# Patient Record
Sex: Female | Born: 1950 | Race: White | Hispanic: No | Marital: Single | Smoking: Never smoker
Health system: Southern US, Community
[De-identification: ages and names within clinical notes are randomized; demographics above are authoritative.]

## PROBLEM LIST (undated history)

## (undated) DIAGNOSIS — E039 Hypothyroidism, unspecified: Secondary | ICD-10-CM

## (undated) DIAGNOSIS — M1990 Unspecified osteoarthritis, unspecified site: Secondary | ICD-10-CM

## (undated) DIAGNOSIS — J45909 Unspecified asthma, uncomplicated: Secondary | ICD-10-CM

## (undated) HISTORY — PX: ANTERIOR CRUCIATE LIGAMENT REPAIR: SHX115

## (undated) HISTORY — PX: EYE SURGERY: SHX253

---

## 2004-04-28 ENCOUNTER — Emergency Department (HOSPITAL_COMMUNITY): Admission: EM | Admit: 2004-04-28 | Discharge: 2004-04-28 | Payer: Self-pay | Admitting: Emergency Medicine

## 2009-11-07 ENCOUNTER — Encounter: Admission: RE | Admit: 2009-11-07 | Discharge: 2009-11-07 | Payer: Self-pay | Admitting: General Surgery

## 2011-04-11 IMAGING — CR DG KNEE 1-2V*R*
2 series · 2 of 2 positions shown · non-contrast
Comparison: None.

CLINICAL DATA: Bilateral knee pain.

RIGHT KNEE - 1-2 VIEW

[w knee ap right]
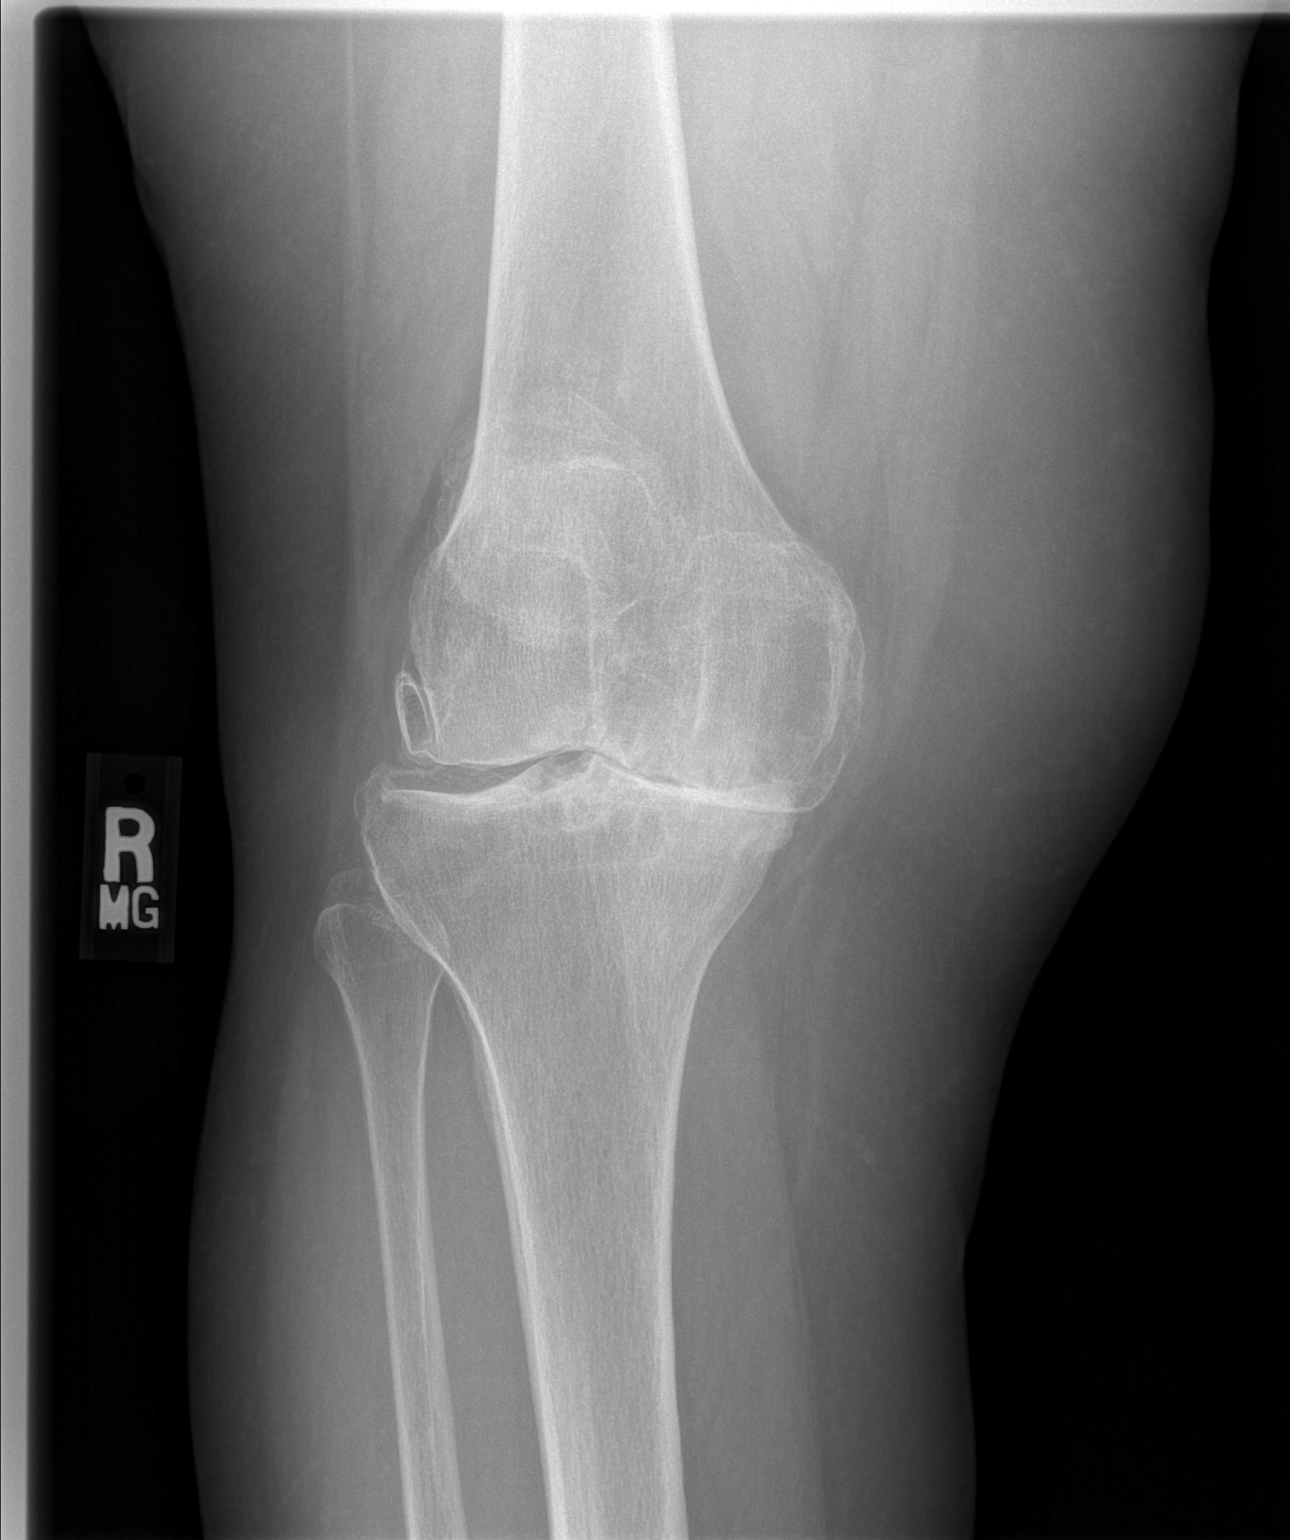

[w knee lat. right]
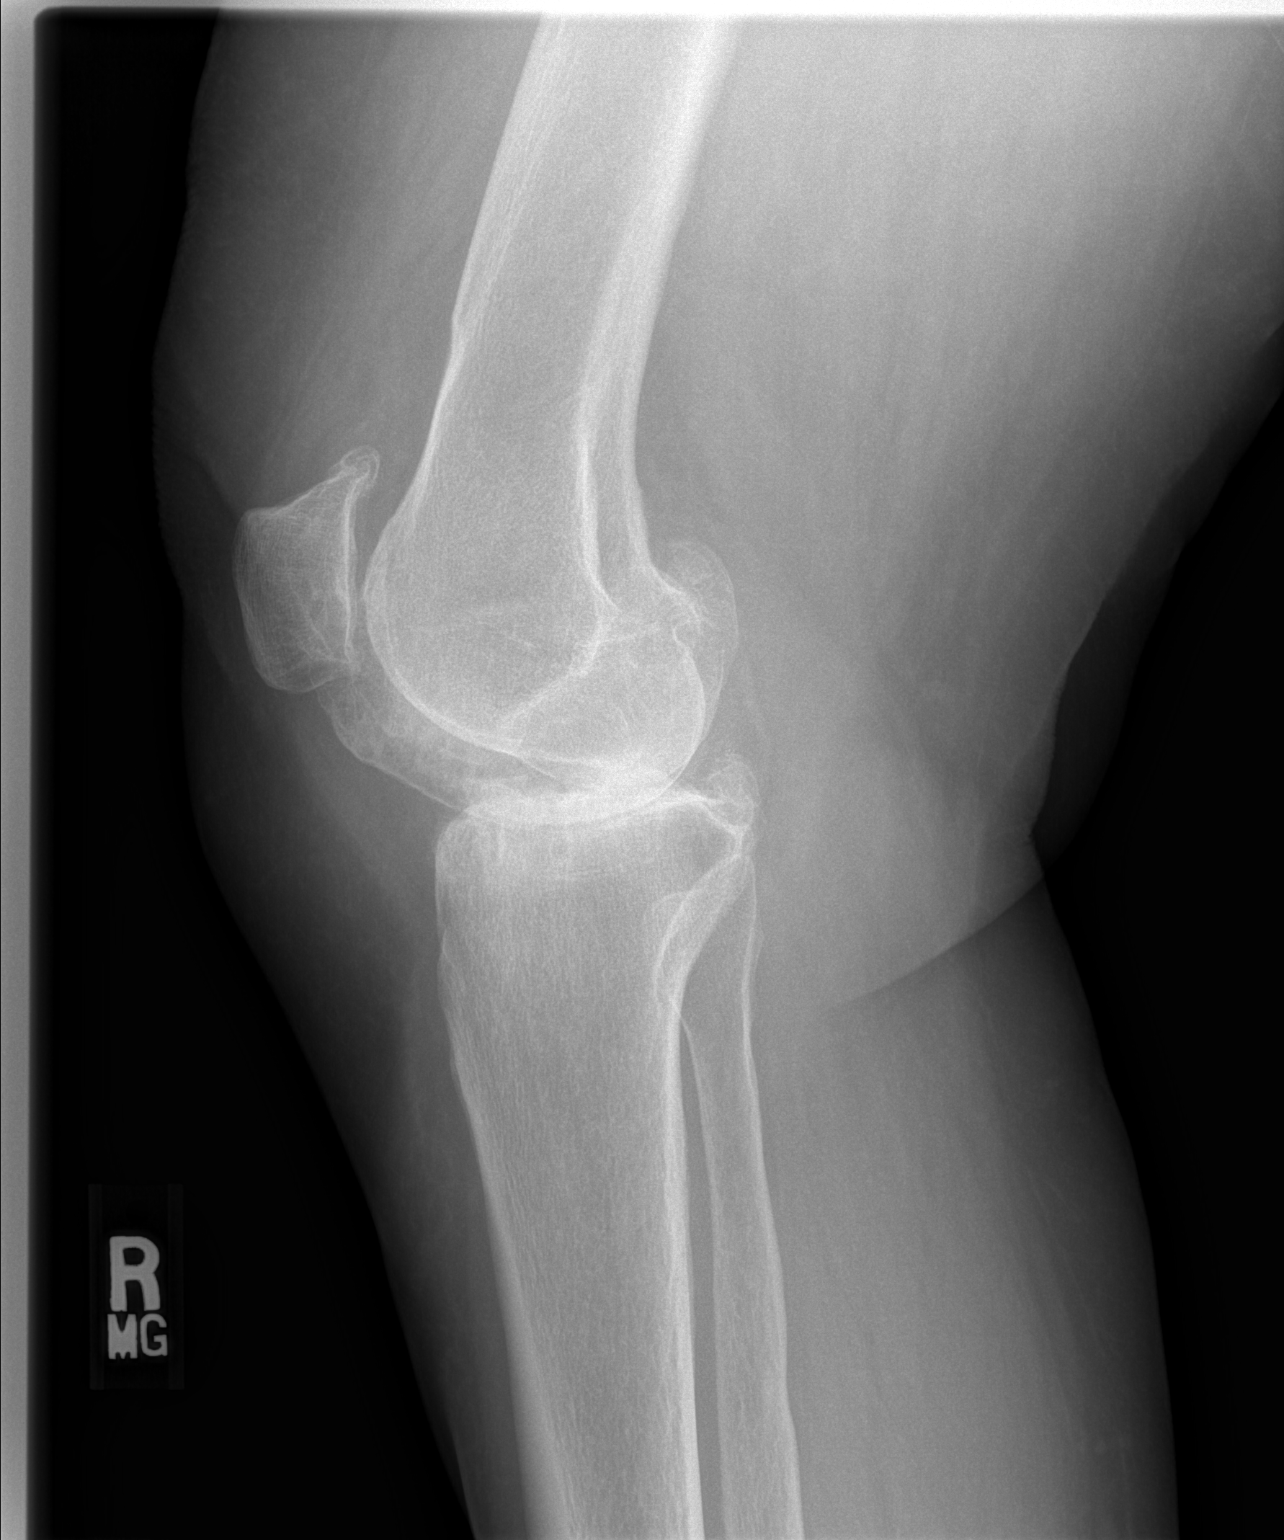

[2 of 2 positions shown; findings below may reference images not displayed]

FINDINGS: Severe osteoarthritis noted, especially in the medial
compartment where there is elimination of the articular space and
"bone on bone" appearance.  Prominent spurring is also present in
the lateral compartment and patellofemoral joint with some loss of
articular space.
IMPRESSION: 1.  Severe osteoarthritis of the right knee.

## 2013-06-24 ENCOUNTER — Encounter (HOSPITAL_COMMUNITY): Payer: Self-pay | Admitting: Pharmacist

## 2013-06-29 NOTE — Patient Instructions (Addendum)
Your procedure is scheduled on:  Friday, July 09, 2013  Enter through the Main Entrance of [facility] at: 6:00 am  Pick up the phone at the desk and dial [phone].  Call this number if you have problems the morning of surgery: [phone].  Remember: Do NOT eat food: After midnight 07/08/2013 Do NOT drink clear liquids after: After midnight 07/08/2013 Do NOT take herbal supplement for 2 weeks prior to surgery Take these medicines the morning of surgery with a SIP OF WATER: Ashby Dawes Thyroid  Do NOT wear jewelry (body piercing), metal hair clips/bobby pins, make-up, or nail polish. Do NOT wear lotions, powders, or perfumes.  You may wear deoderant. Do NOT shave for 48 hours prior to surgery. Do NOT bring valuables to the hospital. Contacts, dentures, or bridgework may not be worn into surgery.  Have a responsible adult drive you home and stay with you for 24 hours after your procedure

## 2013-06-30 ENCOUNTER — Encounter (HOSPITAL_COMMUNITY)
Admission: RE | Admit: 2013-06-30 | Discharge: 2013-06-30 | Disposition: A | Discharge status: Home / Self Care | Payer: Medicare Other | Source: Ambulatory Visit | Attending: Obstetrics and Gynecology | Admitting: Obstetrics and Gynecology

## 2013-06-30 ENCOUNTER — Encounter (HOSPITAL_COMMUNITY): Payer: Self-pay

## 2013-06-30 DIAGNOSIS — Z01812 Encounter for preprocedural laboratory examination: Secondary | ICD-10-CM | POA: Insufficient documentation

## 2013-06-30 DIAGNOSIS — Z01810 Encounter for preprocedural cardiovascular examination: Secondary | ICD-10-CM | POA: Insufficient documentation

## 2013-06-30 HISTORY — DX: Hypothyroidism, unspecified: E03.9

## 2013-06-30 HISTORY — DX: Unspecified asthma, uncomplicated: J45.909

## 2013-06-30 HISTORY — DX: Unspecified osteoarthritis, unspecified site: M19.90

## 2013-06-30 LAB — CBC
HEMATOCRIT: 41.3 % (ref 36.0–46.0)
HEMOGLOBIN: 14 g/dL (ref 12.0–15.0)
MCH: 32.2 pg (ref 26.0–34.0)
MCHC: 33.9 g/dL (ref 30.0–36.0)
MCV: 94.9 fL (ref 78.0–100.0)
Platelets: 252 10*3/uL (ref 150–400)
RBC: 4.35 MIL/uL (ref 3.87–5.11)
RDW: 14.5 % (ref 11.5–15.5)
WBC: 8 10*3/uL (ref 4.0–10.5)

## 2013-06-30 LAB — BASIC METABOLIC PANEL
BUN: 12 mg/dL (ref 6–23)
CHLORIDE: 106 meq/L (ref 96–112)
CO2: 24 meq/L (ref 19–32)
Calcium: 9.1 mg/dL (ref 8.4–10.5)
Creatinine, Ser: 0.68 mg/dL (ref 0.50–1.10)
GFR calc Af Amer: >90 mL/min (ref >90)
GFR calc non Af Amer: >90 mL/min (ref >90)
GLUCOSE: 114 mg/dL — AB (ref 70–99)
POTASSIUM: 4.4 meq/L (ref 3.7–5.3)
Sodium: 140 mEq/L (ref 137–147)

## 2013-06-30 NOTE — Pre-Procedure Instructions (Signed)
Dr. Malen Gauze made aware of abnormal EKG. Jillian Mcgee denies chest pain, shortness of breath with exertion.  No old EKG available  patient states her last EKG was over 30 years ago.  No new orders received at this time.

## 2013-07-09 ENCOUNTER — Ambulatory Visit (HOSPITAL_COMMUNITY)
Admission: RE | Admit: 2013-07-09 | Discharge: 2013-07-09 | Disposition: A | Discharge status: Home / Self Care | Payer: Medicare Other | Source: Ambulatory Visit | Attending: Obstetrics and Gynecology | Admitting: Obstetrics and Gynecology

## 2013-07-09 ENCOUNTER — Encounter (HOSPITAL_COMMUNITY)
Admission: RE | Disposition: A | Discharge status: Home / Self Care | Payer: Self-pay | Source: Ambulatory Visit | Attending: Obstetrics and Gynecology

## 2013-07-09 ENCOUNTER — Encounter (HOSPITAL_COMMUNITY): Payer: Medicare Other | Admitting: Anesthesiology

## 2013-07-09 ENCOUNTER — Ambulatory Visit (HOSPITAL_COMMUNITY): Payer: Medicare Other | Admitting: Anesthesiology

## 2013-07-09 ENCOUNTER — Encounter (HOSPITAL_COMMUNITY): Payer: Self-pay

## 2013-07-09 DIAGNOSIS — R9389 Abnormal findings on diagnostic imaging of other specified body structures: Secondary | ICD-10-CM | POA: Insufficient documentation

## 2013-07-09 DIAGNOSIS — J45909 Unspecified asthma, uncomplicated: Secondary | ICD-10-CM | POA: Insufficient documentation

## 2013-07-09 DIAGNOSIS — E039 Hypothyroidism, unspecified: Secondary | ICD-10-CM | POA: Insufficient documentation

## 2013-07-09 DIAGNOSIS — N950 Postmenopausal bleeding: Secondary | ICD-10-CM

## 2013-07-09 HISTORY — PX: DILATION AND CURETTAGE OF UTERUS: SHX78

## 2013-07-09 SURGERY — DILATION AND CURETTAGE
Anesthesia: Monitor Anesthesia Care | Site: Vagina

## 2013-07-09 MED ORDER — DEXAMETHASONE SODIUM PHOSPHATE 10 MG/ML IJ SOLN
INTRAMUSCULAR | Status: AC
  Filled 2013-07-09: qty 1

## 2013-07-09 MED ORDER — MEPERIDINE HCL 25 MG/ML IJ SOLN: 6.2500 mg | INTRAMUSCULAR | Status: DC | PRN

## 2013-07-09 MED ORDER — FENTANYL CITRATE 0.05 MG/ML IJ SOLN
INTRAMUSCULAR | Status: DC | PRN
  Administered 2013-07-09 (×2): 50 ug via INTRAVENOUS

## 2013-07-09 MED ORDER — FLUCONAZOLE 150 MG PO TABS: 150.0000 mg | ORAL_TABLET | Freq: Every day | ORAL | Status: AC

## 2013-07-09 MED ORDER — KETOROLAC TROMETHAMINE 30 MG/ML IJ SOLN
INTRAMUSCULAR | Status: DC | PRN
  Administered 2013-07-09: 30 mg via INTRAVENOUS

## 2013-07-09 MED ORDER — LIDOCAINE HCL 1 % IJ SOLN
INTRAMUSCULAR | Status: AC
  Filled 2013-07-09: qty 20

## 2013-07-09 MED ORDER — MIDAZOLAM HCL 2 MG/2ML IJ SOLN
INTRAMUSCULAR | Status: DC | PRN
  Administered 2013-07-09: 1 mg via INTRAVENOUS
  Administered 2013-07-09: 2 mg via INTRAVENOUS
  Administered 2013-07-09: 1 mg via INTRAVENOUS

## 2013-07-09 MED ORDER — CEFAZOLIN SODIUM-DEXTROSE 2-3 GM-% IV SOLR
INTRAVENOUS | Status: AC
  Administered 2013-07-09: 2 g via INTRAVENOUS
  Filled 2013-07-09: qty 50

## 2013-07-09 MED ORDER — PROPOFOL 10 MG/ML IV EMUL
INTRAVENOUS | Status: AC
  Filled 2013-07-09: qty 40

## 2013-07-09 MED ORDER — LIDOCAINE HCL 1 % IJ SOLN
INTRAMUSCULAR | Status: DC | PRN
  Administered 2013-07-09: 20 mL

## 2013-07-09 MED ORDER — DEXAMETHASONE SODIUM PHOSPHATE 10 MG/ML IJ SOLN
INTRAMUSCULAR | Status: DC | PRN
  Administered 2013-07-09: 4 mg via INTRAVENOUS

## 2013-07-09 MED ORDER — FENTANYL CITRATE 0.05 MG/ML IJ SOLN
25.0000 ug | INTRAMUSCULAR | Status: DC | PRN
Start: 2013-07-09 — End: 2013-07-09

## 2013-07-09 MED ORDER — MIDAZOLAM HCL 2 MG/2ML IJ SOLN
INTRAMUSCULAR | Status: AC
  Filled 2013-07-09: qty 2

## 2013-07-09 MED ORDER — FENTANYL CITRATE 0.05 MG/ML IJ SOLN
INTRAMUSCULAR | Status: AC
  Filled 2013-07-09: qty 2

## 2013-07-09 MED ORDER — KETOROLAC TROMETHAMINE 30 MG/ML IJ SOLN
INTRAMUSCULAR | Status: AC
  Filled 2013-07-09: qty 1

## 2013-07-09 MED ORDER — ONDANSETRON HCL 4 MG/2ML IJ SOLN
INTRAMUSCULAR | Status: AC
  Filled 2013-07-09: qty 2

## 2013-07-09 MED ORDER — ETOMIDATE 2 MG/ML IV SOLN
INTRAVENOUS | Status: DC | PRN
  Administered 2013-07-09 (×2): 2 mg via INTRAVENOUS
  Administered 2013-07-09: 6 mg via INTRAVENOUS
  Administered 2013-07-09: 10 mg via INTRAVENOUS
  Administered 2013-07-09: 6 mg via INTRAVENOUS

## 2013-07-09 MED ORDER — ONDANSETRON HCL 4 MG/2ML IJ SOLN: 4.0000 mg | Freq: Once | INTRAMUSCULAR | Status: DC | PRN

## 2013-07-09 MED ORDER — KETOROLAC TROMETHAMINE 30 MG/ML IJ SOLN: 15.0000 mg | Freq: Once | INTRAMUSCULAR | Status: DC | PRN

## 2013-07-09 MED ORDER — LIDOCAINE HCL (CARDIAC) 20 MG/ML IV SOLN
INTRAVENOUS | Status: AC
  Filled 2013-07-09: qty 5

## 2013-07-09 MED ORDER — LACTATED RINGERS IV SOLN
INTRAVENOUS | Status: DC
  Administered 2013-07-09 (×2): via INTRAVENOUS

## 2013-07-09 MED ORDER — SILVER NITRATE-POT NITRATE 75-25 % EX MISC
CUTANEOUS | Status: AC
  Filled 2013-07-09: qty 3

## 2013-07-09 SURGICAL SUPPLY — 11 items
CATH ROBINSON RED A/P 16FR (CATHETERS) ×3 IMPLANT
CLOTH BEACON ORANGE TIMEOUT ST (SAFETY) ×3 IMPLANT
CONTAINER PREFILL 10% NBF 60ML (FORM) ×6 IMPLANT
GLOVE BIO SURGEON STRL SZ 6.5 (GLOVE) ×4 IMPLANT
GLOVE BIO SURGEONS STRL SZ 6.5 (GLOVE) ×2
GOWN STRL REUS W/TWL LRG LVL3 (GOWN DISPOSABLE) ×6 IMPLANT
PACK VAGINAL MINOR WOMEN LF (CUSTOM PROCEDURE TRAY) ×3 IMPLANT
PAD OB MATERNITY 4.3X12.25 (PERSONAL CARE ITEMS) ×3 IMPLANT
PAD PREP 24X48 CUFFED NSTRL (MISCELLANEOUS) ×3 IMPLANT
TOWEL OR 17X24 6PK STRL BLUE (TOWEL DISPOSABLE) ×6 IMPLANT
WATER STERILE IRR 1000ML POUR (IV SOLUTION) ×3 IMPLANT

## 2013-07-09 NOTE — Op Note (Signed)
[name], Jillian      ACCOUNT NO.:  [id number]  MEDICAL RECORD NO.:  [id number]  LOCATION:  [facility]                          FACILITY:  [facility]  PHYSICIAN:  Ariza Evans L. Kamill Fulbright, M.D.DATE OF BIRTH:  [birth year]  DATE OF PROCEDURE:  07/09/2013 DATE OF DISCHARGE:                              OPERATIVE REPORT   PREOPERATIVE DIAGNOSIS:  Postmenopausal bleeding.  POSTOPERATIVE DIAGNOSIS:  Postmenopausal bleeding.  PROCEDURE:  Dilation and curettage.  SURGEON:  Coy Rochford L. Vincente PoliGrewal, MD  ANESTHESIA:  General, paracervical block, IV sedation.  EBL:  Minimal.  COMPLICATIONS:  None.  DRAINS:  None.  PATHOLOGY:  Uterine curettings.  DESCRIPTION OF PROCEDURE:  The patient was taken to the operating room. Her anesthesia was administered.  She was placed in the low lithotomy position.  She was prepped and draped.  Initially, the anesthesiologist gave her IV sedation, however, she is very uncomfortable with even the external vulvar prep, so I requested for her to receive more anesthesia. We were unable to do  initially the anterior vaginal prep because she was tensing too much and so I did place a sterile drape with the intent to do the vaginal prep once I inserted the speculum.  I inserted the speculum and the patient was very uncomfortable with that.  We even tried a smaller speculum.  At that point, I could not visualize the cervix at all  and I requested Anesthesia to convert to a general which was what was performed.  After she was basically given general she was then placed in a high lithotomy position.  I was then able to insert the speculum and then see her cervix which was very deep in the vagina.  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  The cervix was cleansed with Betadine.  The cervix was grasped with 2 tenaculum the anterior lip and the posterior lip, I was then able to bring it down a little bit.   I then placed a paracervical block.  I dilated the cervix with Shawnie PonsPratt dilators, inserted a small curette and thoroughly curetted the uterus of all tissue.  This was sent to pathology.  Minimal bleeding was noted. The speculum was removed.  There was a small area of oozing at the perineum. Because her introitus was so small the vagina was stenotic and I did place some silver nitrate there which then a hemostasis was achieved. All sponge, lap, and instrument counts were correct x2.  The patient went to recovery room in stable condition.     Jishnu Jenniges L. Vincente PoliGrewal, M.D.     [name]  D:  07/09/2013  T:  07/09/2013  Job:  [id number]

## 2013-07-09 NOTE — Brief Op Note (Signed)
07/09/2013  8:12 AM  PATIENT:  Jillian Mcgee  63 y.o. female  PRE-OPERATIVE DIAGNOSIS:  PMB  POST-OPERATIVE DIAGNOSIS:  postmenopausal bleeding  PROCEDURE:  Procedure(s): DILATATION AND CURETTAGE (N/A)  SURGEON:  Surgeon(s) and Role:    * Jeani HawkingMichelle L Grewal, MD - Primary  PHYSICIAN ASSISTANT:   ASSISTANTS: none   ANESTHESIA:   general, IV sedation and paracervical block  EBL:     BLOOD ADMINISTERED:none  DRAINS: none   LOCAL MEDICATIONS USED:  LIDOCAINE   SPECIMEN:  Source of Specimen:  uterine currettings  DISPOSITION OF SPECIMEN:  PATHOLOGY  COUNTS:  YES  TOURNIQUET:  * No tourniquets in log *  DICTATION: .Other Dictation: Dictation Number [id number]  PLAN OF CARE: Discharge to home after PACU  PATIENT DISPOSITION:  PACU - hemodynamically stable.   Delay start of Pharmacological VTE agent (>24hrs) due to surgical blood loss or risk of bleeding: not applicable

## 2013-07-09 NOTE — H&P (Signed)
Jillian Mcgee is an 63 year old G 0 with postmenopausal bleeding. On HRT. Ultrasound revealed endometrial stripe of 7.9 mm.  Pertinent Gynecological History: Menses: flow is light Bleeding: post menopausal bleeding Contraception: none DES exposure: positive Blood transfusions: none Sexually transmitted diseases: no past history Previous GYN Procedures: none  Last mammogram: normal Date: 2014 Last pap: normal Date: 2015 OB History: G0, P0   Menstrual History: Menarche age: unknown  No LMP recorded. Patient is postmenopausal.    Past Medical History  Diagnosis Date  . Asthma     due to allergies  . Hypothyroidism   . Arthritis     severe    Past Surgical History  Procedure Laterality Date  . Eye surgery    . Anterior cruciate ligament repair      No family history on file.  Social History:  reports that she has never smoked. She has never used smokeless tobacco. She reports that she drinks alcohol. She reports that she does not use illicit drugs.  Allergies:  Allergies  Allergen Reactions  . Sulfa Antibiotics Anaphylaxis  . Beef-Derived Products   . Eggs Or Egg-Derived Products     Egg whites, egg yolk  . Garlic   . Other     White/navy bean, almonds, pecans, lemons, grape/raisin, green bell peppers, cantaloupe/muskmelon  . Pineapple   . Sesame Seed [Sesame Oil]   . Wheat Bran     Prescriptions prior to admission  Medication Sig Dispense Refill  . ARMOUR THYROID PO Take 97.2 mg by mouth daily. USES NATURE'S THYROID BRAND 1.5 grains each      . azelastine (ASTELIN) 0.1 % nasal spray Place 1 spray into both nostrils 2 (two) times daily as needed for allergies. Use in each nostril as directed      . calcium-vitamin D (OSCAL WITH D) 500-200 MG-UNIT per tablet Take 2 tablets by mouth daily with breakfast.      . diazepam (VALIUM) 5 MG tablet Take 10 mg by mouth at bedtime as needed for muscle spasms.      . diphenhydrAMINE (BENADRYL) 25 MG tablet Take  25-50 mg by mouth 2 (two) times daily as needed for allergies.      Marland Kitchen. glucosamine-chondroitin 500-400 MG tablet Take 1 tablet by mouth daily.      Marland Kitchen. HYDROcodone-acetaminophen (NORCO) 10-325 MG per tablet Take 1 tablet by mouth every 6 (six) hours as needed for moderate pain.      Marland Kitchen. ipratropium (ATROVENT) 0.03 % nasal spray Place 2 sprays into both nostrils as needed for rhinitis.      Marland Kitchen. KRILL OIL PO Take 1 capsule by mouth daily.      . Magnesium 200 MG TABS Take 1 tablet by mouth 2 (two) times daily.      . montelukast (SINGULAIR) 10 MG tablet Take 10 mg by mouth at bedtime.      . Multiple Vitamin (MULTIVITAMIN WITH MINERALS) TABS tablet Take 1 tablet by mouth daily.      . NON FORMULARY Take 1-3 capsules by mouth daily. REISHI MUSHROOM, 480 mg dose daily per patient information.      . NON FORMULARY Take 1 capsule by mouth daily. TYNASTMA      . NON FORMULARY Take 2-3 capsules by mouth daily. PRICKLY PEAR CACTUS (FOR INFLAMMATION)      . NONFORMULARY OR COMPOUNDED ITEM Apply 1 application topically daily. TESTOSTERONE 1 % CREAM      . NONFORMULARY OR COMPOUNDED ITEM Apply 1 application  topically daily. BIEST ESTRADIOL CREAM (NO PROGESTERONE) 16MG -ML (9.64MG -ML)      . Omega-3 Fatty Acids (FISH OIL PO) Take 1 capsule by mouth daily.      . Progesterone Micronized (PROGESTERONE PO) Take 150 mg by mouth at bedtime.      . Selenium 200 MCG CAPS Take 1 capsule by mouth daily.      . Vitamin D, Ergocalciferol, (DRISDOL) 50000 UNITS CAPS capsule Take 50,000 Units by mouth every 7 (seven) days.      Marland Kitchen. ZINC SULFATE PO Take 1 capsule by mouth daily. 200 mg capsule.        Review of Systems  All other systems reviewed and are negative.   Blood pressure 124/76, pulse 77, temperature 97.9 F (36.6 C), temperature source Oral, resp. rate 18, SpO2 99.00%. Physical Exam  Nursing note and vitals reviewed. Constitutional: She appears well-developed.  HENT:  Head: Normocephalic.  Eyes: Pupils are  equal, round, and reactive to light.  Neck: Normal range of motion.  Cardiovascular: Normal rate.   Respiratory: Effort normal.    No results found for this or any previous visit (from the past 24 hour(s)).  No results found.  Assessment/Plan: Postmenopausal bleeding Thickened endometrium D and C Risks reviewed  Consent signed Rudolf Blizard L 07/09/2013, 7:24 AM

## 2013-07-09 NOTE — Transfer of Care (Signed)
Immediate Anesthesia Transfer of Care Note  Patient: Jillian Mcgee  Procedure(s) Performed: Procedure(s): DILATATION AND CURETTAGE (N/A)  Patient Location: PACU  Anesthesia Type:MAC and General  Level of Consciousness: awake and alert   Airway & Oxygen Therapy: Patient Spontanous Breathing  Post-op Assessment: Report given to PACU RN and Post -op Vital signs reviewed and stable  Post vital signs: Reviewed and stable  Complications: No apparent anesthesia complications

## 2013-07-09 NOTE — Anesthesia Preprocedure Evaluation (Signed)
Anesthesia Evaluation  Patient identified by MRN, date of birth, ID band Patient awake    Reviewed: Allergy & Precautions, H&P , NPO status , Patient's Chart, lab work & pertinent test results  Airway Mallampati: I TM Distance: >3 FB Neck ROM: full    Dental no notable dental hx. (+) Teeth Intact   Pulmonary          Cardiovascular negative cardio ROS      Neuro/Psych negative neurological ROS  negative psych ROS   GI/Hepatic negative GI ROS, Neg liver ROS,   Endo/Other  Hypothyroidism   Renal/GU negative Renal ROS     Musculoskeletal   Abdominal Normal abdominal exam  (+)   Peds  Hematology negative hematology ROS (+)   Anesthesia Other Findings   Reproductive/Obstetrics negative OB ROS                           Anesthesia Physical Anesthesia Plan  ASA: II  Anesthesia Plan: MAC   Post-op Pain Management:    Induction: Intravenous  Airway Management Planned:   Additional Equipment:   Intra-op Plan:   Post-operative Plan:   Informed Consent: I have reviewed the patients History and Physical, chart, labs and discussed the procedure including the risks, benefits and alternatives for the proposed anesthesia with the patient or authorized representative who has indicated his/her understanding and acceptance.     Plan Discussed with: CRNA and Surgeon  Anesthesia Plan Comments:         Anesthesia Quick Evaluation

## 2013-07-09 NOTE — Anesthesia Postprocedure Evaluation (Signed)
  Anesthesia Post-op Note  Anesthesia Post Note  Patient: Jillian MessingCatherine A Cooper-Ruska  Procedure(s) Performed: Procedure(s) (LRB): DILATATION AND CURETTAGE (N/A)  Anesthesia type: General  Patient location: PACU  Post pain: Pain level controlled  Post assessment: Post-op Vital signs reviewed  Last Vitals:  Filed Vitals:   07/09/13 0900  BP: 123/77  Pulse: 79  Temp: 36.7 C  Resp: 21    Post vital signs: Reviewed  Level of consciousness: sedated  Complications: No apparent anesthesia complications

## 2013-07-09 NOTE — Discharge Instructions (Signed)
DISCHARGE INSTRUCTIONS: D&C  The following instructions have been prepared to help you care for yourself upon your return home.  MAY TAKE IBUPROFEN (MOTRIN, ADVIL) OR ALEVE AFTER 2:00 PM!!!!   Personal hygiene:  Use sanitary pads for vaginal drainage, not tampons.  Shower the day after your procedure.  NO tub baths, pools or Jacuzzis for 2-3 weeks.  Wipe front to back after using the bathroom.  Activity and limitations:  Do NOT drive or operate any equipment for 24 hours. The effects of anesthesia are still present and drowsiness may result.  Do NOT rest in bed all day.  Walking is encouraged.  Walk up and down stairs slowly.  You may resume your normal activity in one to two days or as indicated by your physician.  Sexual activity: NO intercourse for at least 2 weeks after the procedure, or as indicated by your physician.  Diet: Eat a light meal as desired this evening. You may resume your usual diet tomorrow.  Return to work: You may resume your work activities in one to two days or as indicated by your doctor.  What to expect after your surgery: Expect to have vaginal bleeding/discharge for 2-3 days and spotting for up to 10 days. It is not unusual to have soreness for up to 1-2 weeks. You may have a slight burning sensation when you urinate for the first day. Mild cramps may continue for a couple of days. You may have a regular period in 2-6 weeks.  Call your doctor for any of the following:  Excessive vaginal bleeding, saturating and changing one pad every hour.  Inability to urinate 6 hours after discharge from hospital.  Pain not relieved by pain medication.  Fever of 100.4 F or greater.  Unusual vaginal discharge or odor.   Call for an appointment:    Patients signature: ______________________  Nurses signature ________________________  Support person's signature_______________________

## 2013-07-12 ENCOUNTER — Encounter (HOSPITAL_COMMUNITY): Payer: Self-pay | Admitting: Obstetrics and Gynecology

## 2016-02-19 ENCOUNTER — Other Ambulatory Visit: Payer: Self-pay | Admitting: Pain Medicine

## 2019-09-10 ENCOUNTER — Telehealth: Payer: Self-pay | Admitting: Physician Assistant

## 2019-09-10 NOTE — Telephone Encounter (Signed)
I connected by phone with Jillian Mcgee and/or patient's caregiver on 09/10/2019 at 7:21 PM to discuss the potential vaccination through our Homebound vaccination initiative.   Prevaccination Checklist for COVID-19 Vaccines  1.  Are you feeling sick today? no  2.  Have you ever received a dose of a COVID-19 vaccine?  no      If yes, which one? None   3.  Have you ever had an allergic reaction: (This would include a severe reaction [ e.g., anaphylaxis] that required treatment with epinephrine or EpiPen or that caused you to go to the hospital.  It would also include an allergic reaction that occurred within 4 hours that caused hives, swelling, or respiratory distress, including wheezing.) A.  A previous dose of COVID-19 vaccine. no  B.  A vaccine or injectable therapy that contains multiple components, one of which is a COVID-19 vaccine component, but it is not known which component elicited the immediate reaction. no  C.  Are you allergic to polyethylene glycol? no  D. Are you allergic to Polysorbate, which is found in some vaccines, film coated tablets and intravenous steroids?  no   4.  Have you ever had an allergic reaction to another vaccine (other than COVID-19 vaccine) or an injectable medication? (This would include a severe reaction [ e.g., anaphylaxis] that required treatment with epinephrine or EpiPen or that caused you to go to the hospital.  It would also include an allergic reaction that occurred within 4 hours that caused hives, swelling, or respiratory distress, including wheezing.)  no   5.  Have you ever had a severe allergic reaction (e.g., anaphylaxis) to something other than a component of the COVID-19 vaccine, or any vaccine or injectable medication?  This would include food, pet, venom, environmental, or oral medication allergies.  yes, sulfa drugs (as a child, doesn't remember)  6.  Have you received any vaccine in the last 14 days? no   7.  Have you ever had a  positive test for COVID-19 or has a doctor ever told you that you had COVID-19?  no   8.  Have you received passive antibody therapy (monoclonal antibodies or convalescent serum) as a treatment for COVID-19? no   9.  Do you have a weakened immune system caused by something such as HIV infection or cancer or do you take immunosuppressive drugs or therapies?  no   10.  Do you have a bleeding disorder or are you taking a blood thinner? no   11.  Are you pregnant or breast-feeding? no   12.  Do you have dermal fillers? no   __________________   This patient is a 69 y.o. female that meets the FDA criteria to receive homebound vaccination. Patient or parent/caregiver understands they have the option to accept or refuse homebound vaccination.  Patient passed the pre-screening checklist and would like to proceed with homebound vaccination.  Based on questionnaire above, I recommend the patient be observed for 30 minutes.  There are no other household members/caregivers who are also interested in receiving the vaccine.   I will send the patient's information to our scheduling team who will reach out to schedule the patient and potential caregiver/family members for homebound vaccination.   Cline Crock 09/10/2019 7:21 PM

## 2019-09-13 ENCOUNTER — Ambulatory Visit: Payer: Medicare Other

## 2019-09-14 ENCOUNTER — Ambulatory Visit: Payer: Medicare Other | Attending: Critical Care Medicine

## 2019-09-14 DIAGNOSIS — Z23 Encounter for immunization: Secondary | ICD-10-CM

## 2019-09-14 NOTE — Progress Notes (Signed)
   Covid-19 Vaccination Clinic  Name:  Jillian Mcgee    MRN: [id number] DOB: 1950-12-31  09/14/2019  [name] was observed post Covid-19 immunization for 30 minutes based on pre-vaccination screening without incident. She was provided with Vaccine Information Sheet and instruction to access the V-Safe system.   [name] was instructed to call 911 with any severe reactions post vaccine: Marland Kitchen Difficulty breathing  . Swelling of face and throat  . A fast heartbeat  . A bad rash all over body  . Dizziness and weakness   Immunizations Administered    Name Date Dose VIS Date Route   Moderna COVID-19 Vaccine 09/14/2019  9:54 AM 0.5 mL 12/2018 Intramuscular   Manufacturer: Moderna   Lot: [id number]   NDC: 43888-757-97

## 2019-10-12 ENCOUNTER — Ambulatory Visit: Payer: Medicare Other | Attending: Critical Care Medicine

## 2019-10-12 DIAGNOSIS — Z23 Encounter for immunization: Secondary | ICD-10-CM

## 2019-10-12 NOTE — Progress Notes (Signed)
   Covid-19 Vaccination Clinic  Name:  Jillian Mcgee    MRN: [id number] DOB: [birth year]  10/12/2019  [name] was observed post Covid-19 immunization for q 15 min without incident. She was provided with Vaccine Information Sheet and instruction to access the V-Safe system.   Ms. Done was instructed to call 911 with any severe reactions post vaccine: Marland Kitchen Difficulty breathing  . Swelling of face and throat  . A fast heartbeat  . A bad rash all over body  . Dizziness and weakness   Immunizations Administered    Name Date Dose VIS Date Route   Moderna COVID-19 Vaccine 10/12/2019 10:34 AM 0.5 mL 12/2018 Intramuscular   Manufacturer: Moderna   Lot: [id number]   NDC: 57473-403-70

## 2020-04-04 ENCOUNTER — Ambulatory Visit: Payer: Medicare Other | Attending: Critical Care Medicine

## 2020-04-04 DIAGNOSIS — Z23 Encounter for immunization: Secondary | ICD-10-CM

## 2020-04-04 NOTE — Progress Notes (Signed)
   Covid-19 Vaccination Clinic  Name:  Jillian Mcgee    MRN: [id number] DOB: December 16, 1950  04/04/2020  Jillian Mcgee was observed post Covid-19 immunization for 15 min without incident. She was provided with Vaccine Information Sheet and instruction to access the V-Safe system.   Jillian Mcgee was instructed to call 911 with any severe reactions post vaccine: Marland Kitchen Difficulty breathing  . Swelling of face and throat  . A fast heartbeat  . A bad rash all over body  . Dizziness and weakness   Immunizations Administered    Name Date Dose VIS Date Route   Moderna Covid-19 Booster Vaccine 04/04/2020 10:20 AM 0.25 mL 11/10/2019 Intramuscular   Manufacturer: Moderna   Lot: [id number]   NDC: 45038-882-80
